# Patient Record
Sex: Female | Born: 1995 | Race: White | Hispanic: No | Marital: Single | Smoking: Never smoker
Health system: Southern US, Community
[De-identification: ages and names within clinical notes are randomized; demographics above are authoritative.]

## PROBLEM LIST (undated history)

## (undated) ENCOUNTER — Inpatient Hospital Stay (HOSPITAL_COMMUNITY): Payer: Self-pay

## (undated) DIAGNOSIS — J45909 Unspecified asthma, uncomplicated: Secondary | ICD-10-CM

## (undated) DIAGNOSIS — N83209 Unspecified ovarian cyst, unspecified side: Secondary | ICD-10-CM

## (undated) HISTORY — DX: Unspecified asthma, uncomplicated: J45.909

## (undated) HISTORY — DX: Unspecified ovarian cyst, unspecified side: N83.209

## (undated) HISTORY — PX: WISDOM TOOTH EXTRACTION: SHX21

---
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
## 2016-02-17 ENCOUNTER — Ambulatory Visit: Payer: Self-pay | Admitting: Allergy and Immunology

## 2016-03-02 ENCOUNTER — Ambulatory Visit (INDEPENDENT_AMBULATORY_CARE_PROVIDER_SITE_OTHER): Payer: Managed Care, Other (non HMO) | Admitting: Allergy and Immunology

## 2016-03-02 ENCOUNTER — Encounter: Payer: Self-pay | Admitting: Allergy and Immunology

## 2016-03-02 VITALS — BP 98/72 | HR 68 | Resp 16 | Ht 64.96 in | Wt 145.3 lb

## 2016-03-02 DIAGNOSIS — L209 Atopic dermatitis, unspecified: Secondary | ICD-10-CM | POA: Diagnosis not present

## 2016-03-02 DIAGNOSIS — H1010 Acute atopic conjunctivitis, unspecified eye: Secondary | ICD-10-CM

## 2016-03-02 DIAGNOSIS — J309 Allergic rhinitis, unspecified: Secondary | ICD-10-CM

## 2016-03-02 DIAGNOSIS — J4520 Mild intermittent asthma, uncomplicated: Secondary | ICD-10-CM

## 2016-03-02 DIAGNOSIS — Z91018 Allergy to other foods: Secondary | ICD-10-CM

## 2016-03-02 MED ORDER — DESONIDE 0.05 % EX OINT: TOPICAL_OINTMENT | CUTANEOUS | Status: DC

## 2016-03-02 MED ORDER — FLUTICASONE PROPIONATE HFA 220 MCG/ACT IN AERO: INHALATION_SPRAY | RESPIRATORY_TRACT | Status: DC

## 2016-03-02 MED ORDER — EPINEPHRINE 0.3 MG/0.3ML IJ SOAJ: INTRAMUSCULAR | Status: AC

## 2016-03-02 NOTE — Progress Notes (Signed)
NEW PATIENT NOTE  Referring Provider: No ref. provider found Primary Provider: No PCP Per Patient Date of office visit: 03/02/2016    Subjective:   Chief Complaint:  Isabella Lam (DOB: 06/19/1996) is a 20 y.o. female with a chief complaint of Urticaria and Allergic Rhinitis   who presents to the clinic on 03/02/2016 with the following problems:  HPI Comments: Isabella Lam presents this clinic in evaluation of allergies. She has had a long history of upper airway allergic disease manifested as nasal congestion, sneezing, intermittent anosmia, especially following exposure to dust, mold, and grasses. She is not really found antihistamines to be particularly effective in alleviating her symptoms. She'll use Nasonex as needed when she gets extremely congested. She also has a history of asthma that is relatively inactive. Her last requirement for bronchodilator was October 2016. She does not appear to have an exercise-induced component or cold air induced component. She was hospitalized in 2014 in 2015 for episodes of asthma. However, in 2016 she did not require hospitalization. Apparently both these hospitalizations occurred in May of those years. She was living in the [place] at that point in time. She is a native of [place] and only presented to the [place] area in August 2016. She also has a history of peanut allergy giving rise to breathing problems. She avoids all nuts at this point in time and had an EpiPen when she was younger. Recently she is developed a bit of a rash around her mouth that is slowly resolving.   Past Medical History  Diagnosis Date  . Ovarian cyst   . Asthma     Past Surgical History  Procedure Laterality Date  . Wisdom tooth extraction        Medication List       This list is accurate as of: 03/02/16 11:59 PM.  Always use your most recent med list.               CLARITIN-D 24 HOUR PO  Take by mouth daily.     desonide 0.05 % ointment   Commonly known as:  DESOWEN  Can apply to inflamed skin twice daily as needed.     EPINEPHrine 0.3 mg/0.3 mL Soaj injection  Commonly known as:  EPI-PEN  Use as directed for life-threatening allergic reaction.     fluticasone 220 MCG/ACT inhaler  Commonly known as:  FLOVENT HFA  Inhale two puffs twice daily during asthma flare-up.  Rinse, gargle, and spit after use.     LOW-OGESTREL PO  Take by mouth daily.     mometasone 50 MCG/ACT nasal spray  Commonly known as:  NASONEX  Place 1 spray into the nose as needed.     montelukast 10 MG tablet  Commonly known as:  SINGULAIR  Take 10 mg by mouth daily.     VENTOLIN HFA 108 (90 Base) MCG/ACT inhaler  Generic drug:  albuterol  Inhale 2 puffs into the lungs every 6 (six) hours as needed for wheezing or shortness of breath.        Allergies  Allergen Reactions  . Clindamycin/Lincomycin Hives, Itching and Swelling  . Keflex [Cephalexin] Itching and Swelling  . Oxycodone Hives and Swelling  . Penicillins Itching and Swelling  . Hydrocodone Hives, Itching and Rash  . Hydrocortisone Rash  . Ibuprofen Hives, Itching, Swelling and Rash    Joints swell  . Tylenol [Acetaminophen] Swelling and Rash    Review of systems negative except as noted  in HPI / PMHx or noted below:  Review of Systems  Constitutional: Negative.   HENT: Negative.   Eyes: Negative.   Respiratory: Negative.   Cardiovascular: Negative.   Gastrointestinal: Negative.   Genitourinary: Negative.   Musculoskeletal: Negative.   Skin: Negative.   Neurological: Negative.   Endo/Heme/Allergies: Negative.   Psychiatric/Behavioral: Negative.     Family History  Problem Relation Age of Onset  . Diabetes Mother   . High blood pressure Father   . Diabetes Maternal Grandmother   . High blood pressure Maternal Grandfather     Social History   Social History  . Marital Status: Single    Spouse Name: N/A  . Number of Children: N/A  . Years of Education: N/A    Occupational History  . Not on file.   Social History Main Topics  . Smoking status: Never Smoker   . Smokeless tobacco: Never Used  . Alcohol Use: Not on file  . Drug Use: Not on file  . Sexual Activity: Not on file   Other Topics Concern  . Not on file   Social History Narrative  . No narrative on file    Environmental and Social history  Lives in a dorm room with a slightly damp environment, no animals located inside the residents, carpeting in the bedroom, no plastic on the bed or pillow, and no smokers located inside the household.   Objective:   Filed Vitals:   03/02/16 0855  BP: 98/72  Pulse: 68  Resp: 16   Height: 5' 4.96" (165 cm) Weight: 145 lb 4.5 oz (65.9 kg)  Physical Exam  Constitutional: She is well-developed, well-nourished, and in no distress.  HENT:  Head: Normocephalic. Head is without right periorbital erythema and without left periorbital erythema.  Right Ear: Tympanic membrane, external ear and ear canal normal.  Left Ear: Tympanic membrane, external ear and ear canal normal.  Nose: Nose normal. No mucosal edema or rhinorrhea.  Mouth/Throat: Oropharynx is clear and moist and mucous membranes are normal. No oropharyngeal exudate.  Slight lip erythema and induration  Eyes: Conjunctivae and lids are normal. Pupils are equal, round, and reactive to light.  Neck: Trachea normal. No tracheal deviation present. No thyromegaly present.  Cardiovascular: Normal rate, regular rhythm, S1 normal, S2 normal and normal heart sounds.   No murmur heard. Pulmonary/Chest: Effort normal. No stridor. No tachypnea. No respiratory distress. She has no wheezes. She has no rales. She exhibits no tenderness.  Abdominal: Soft. She exhibits no distension and no mass. There is no hepatosplenomegaly. There is no tenderness. There is no rebound and no guarding.  Musculoskeletal: She exhibits no edema or tenderness.  Lymphadenopathy:       Head (right side): No tonsillar  adenopathy present.       Head (left side): No tonsillar adenopathy present.    She has no cervical adenopathy.    She has no axillary adenopathy.  Neurological: She is alert. Gait normal.  Skin: No rash noted. She is not diaphoretic. No erythema. No pallor. Nails show no clubbing.  Psychiatric: Mood and affect normal.     Diagnostics: Allergy skin tests were performed. She demonstrated hypersensitivity to house dust mite and mold  Spirometry was performed and demonstrated an FEV1 of 2.83 @ 102 % of predicted. Following the administration of nebulized albuterol her FEV1 did not change significantly  The patient had an Asthma Control Test with the following results:  .     Assessment and Plan:  1. Asthma, mild intermittent, well-controlled   2. Allergic rhinoconjunctivitis   3. Food allergy   4. Atopic dermatitis     1. Allergen avoidance measures  2. Treat and prevent inflammation:   A. montelukast 10 mg one tablet one time per day  B. Nasonex 1-2 sprays each nostril one time per day  3. If needed:   A. Ventolin HFA 2 puffs every 4-6 hours  B. OTC antihistamine - Claritin/Allegra/Zyrtec  C. EpiPen, Benadryl, M.D./ER for allergic reaction  D. OTC 1% hydrocortisone cream to inflamed skin twice a day  4. "Action plan" for asthma flare up:   A. start Flovent 220 2 inhalations twice a day with spacing device  B. continue Ventolin HFA if needed  5. Consider immunotherapy  6. Annual fall flu vaccine every year  7. Blood tests - peanut with reflex - possible peanut challenge?  8. Return to clinic in 4 weeks   HopefullyTakia will do well with a combination of allergen avoidance measures and anti-inflammatory medications used in a preventative mode. I'm somewhat worried as we go through this upcoming springtime season she may have some difficulty as this will be her first springtime exposure in [place]. I have given her plan to initiate high-dose inhaled steroids  should she develop a significant problem with her lungs as we move forward. We'll see if things go over the course the next 4 weeks. We'll also see if she is a candidate for a peanut challenge sometime in the future.    Laurette Schimke, MD [facility]

## 2016-03-02 NOTE — Patient Instructions (Addendum)
  1. Allergen avoidance measures  2. Treat and prevent inflammation:   A. montelukast 10 mg one tablet one time per day  B. Nasonex 1-2 sprays each nostril one time per day  3. If needed:   A. Ventolin HFA 2 puffs every 4-6 hours  B. OTC antihistamine - Claritin/Allegra/Zyrtec  C. EpiPen, Benadryl, M.D./ER for allergic reaction  D. Desonide Ointment apply to inflamed skin twice a day  4. "Action plan" for asthma flare up:   A. start Flovent 220 2 inhalations twice a day with spacing device  B. continue Ventolin HFA if needed  5. Consider immunotherapy  6. Annual fall flu vaccine every year  7. Blood tests - peanut with reflex - possible peanut challenge?  8. Return to clinic in 4 weeks

## 2016-03-04 ENCOUNTER — Other Ambulatory Visit: Payer: Self-pay

## 2016-03-04 MED ORDER — BUDESONIDE 180 MCG/ACT IN AEPB: 2.0000 | INHALATION_SPRAY | Freq: Two times a day (BID) | RESPIRATORY_TRACT | Status: AC

## 2016-03-08 ENCOUNTER — Ambulatory Visit: Payer: Self-pay | Admitting: Allergy and Immunology

## 2016-03-28 ENCOUNTER — Other Ambulatory Visit: Payer: Self-pay | Admitting: Allergy and Immunology

## 2016-03-30 ENCOUNTER — Ambulatory Visit: Admitting: Allergy and Immunology

## 2016-03-30 LAB — IGE PEANUT W/COMPONENT REFLEX

## 2016-04-13 ENCOUNTER — Ambulatory Visit (INDEPENDENT_AMBULATORY_CARE_PROVIDER_SITE_OTHER): Payer: Managed Care, Other (non HMO) | Admitting: Allergy and Immunology

## 2016-04-13 DIAGNOSIS — L700 Acne vulgaris: Secondary | ICD-10-CM

## 2016-04-13 DIAGNOSIS — J4520 Mild intermittent asthma, uncomplicated: Secondary | ICD-10-CM

## 2016-04-13 DIAGNOSIS — H1010 Acute atopic conjunctivitis, unspecified eye: Secondary | ICD-10-CM

## 2016-04-13 DIAGNOSIS — Z91018 Allergy to other foods: Secondary | ICD-10-CM | POA: Diagnosis not present

## 2016-04-13 DIAGNOSIS — J309 Allergic rhinitis, unspecified: Secondary | ICD-10-CM | POA: Diagnosis not present

## 2016-04-13 DIAGNOSIS — L209 Atopic dermatitis, unspecified: Secondary | ICD-10-CM | POA: Diagnosis not present

## 2016-04-13 NOTE — Progress Notes (Signed)
Follow-up Note  Referring Provider: No ref. provider found Primary Provider: No PCP Per Patient Date of Office Visit: 04/13/2016  Subjective:   Isabella Lam (DOB: 1996-01-11) is a 20 y.o. female [facility] in re-evaluation of the following:  HPI: Isabella Lam presents to this clinic in reevaluation of her multiorgan atopic disease. She's had a very good response to medical therapy. She's been using montelukast and Nasonex and has had very little problems with her upper or lower airways or skin. She's not had to use Ventolin or desonide. She remains away from peanut and does have an EpiPen. She has not had activate her action plan for an asthma flare. She has performed allergen avoidance measures against house dust mite.    Medication List           budesonide 180 MCG/ACT inhaler  Commonly known as:  PULMICORT FLEXHALER  Inhale 2 puffs into the lungs 2 (two) times daily.     CLARITIN-D 24 HOUR PO  Take by mouth daily.     desonide 0.05 % ointment  Commonly known as:  DESOWEN  Can apply to inflamed skin twice daily as needed.     EPINEPHrine 0.3 mg/0.3 mL Soaj injection  Commonly known as:  EPI-PEN  Use as directed for life-threatening allergic reaction.     fluticasone 220 MCG/ACT inhaler  Commonly known as:  FLOVENT HFA  Inhale two puffs twice daily during asthma flare-up.  Rinse, gargle, and spit after use.     LOW-OGESTREL PO  Take by mouth daily.     mometasone 50 MCG/ACT nasal spray  Commonly known as:  NASONEX  Place 1 spray into the nose as needed.     montelukast 10 MG tablet  Commonly known as:  SINGULAIR  Take 10 mg by mouth daily.     VENTOLIN HFA 108 (90 Base) MCG/ACT inhaler  Generic drug:  albuterol  Inhale 2 puffs into the lungs every 6 (six) hours as needed for wheezing or shortness of breath.        Past Medical History  Diagnosis Date  . Ovarian cyst   . Asthma     Past Surgical History    Procedure Laterality Date  . Wisdom tooth extraction      Allergies  Allergen Reactions  . Clindamycin/Lincomycin Hives, Itching and Swelling  . Keflex [Cephalexin] Itching and Swelling  . Oxycodone Hives and Swelling  . Penicillins Itching and Swelling  . Hydrocodone Hives, Itching and Rash  . Hydrocortisone Rash  . Ibuprofen Hives, Itching, Swelling and Rash    Joints swell  . Tylenol [Acetaminophen] Swelling and Rash    Review of systems negative except as noted in HPI / PMHx or noted below:  Review of Systems  Constitutional: Negative.   HENT: Negative.   Eyes: Negative.   Respiratory: Negative.   Cardiovascular: Negative.   Gastrointestinal: Negative.   Genitourinary: Negative.   Musculoskeletal: Negative.   Skin: Negative.   Neurological: Negative.   Endo/Heme/Allergies: Negative.   Psychiatric/Behavioral: Negative.      Objective:   There were no vitals filed for this visit.        Physical Exam  Constitutional: She is well-developed, well-nourished, and in no distress.  HENT:  Head: Normocephalic.  Right Ear: Tympanic membrane, external ear and ear canal normal.  Left Ear: Tympanic membrane, external ear and ear canal normal.  Nose: Nose normal. No mucosal edema or rhinorrhea.  Mouth/Throat:  Uvula is midline, oropharynx is clear and moist and mucous membranes are normal. No oropharyngeal exudate.  Eyes: Conjunctivae are normal.  Neck: Trachea normal. No tracheal tenderness present. No tracheal deviation present. No thyromegaly present.  Cardiovascular: Normal rate, regular rhythm, S1 normal, S2 normal and normal heart sounds.   No murmur heard. Pulmonary/Chest: Breath sounds normal. No stridor. No respiratory distress. She has no wheezes. She has no rales.  Musculoskeletal: She exhibits no edema.  Lymphadenopathy:       Head (right side): No tonsillar adenopathy present.       Head (left side): No tonsillar adenopathy present.    She has no cervical  adenopathy.  Neurological: She is alert. Gait normal.  Skin: Rash (Acne with cystic lesions on face and slight involvement of upper back) noted. She is not diaphoretic. No erythema. Nails show no clubbing.  Psychiatric: Mood and affect normal.    Diagnostics: Results of a peanut IgG titer obtained on 03/28/2016 was below 0.10 KU/l   Spirometry was performed and demonstrated an FEV1 of 2.41 at 88 % of predicted.  The patient had an Asthma Control Test with the following results:  .    Assessment and Plan:   1. Asthma, mild intermittent, well-controlled   2. Allergic rhinoconjunctivitis   3. Food allergy   4. Atopic dermatitis   5. Acne vulgaris     1. Continue  Allergen avoidance measures  2. Continue to Treat and prevent inflammation:   A. montelukast 10 mg one tablet one time per day  B. Nasonex 1-2 sprays each nostril one time per day  3. If needed:   A. Ventolin HFA 2 puffs every 4-6 hours  B. OTC antihistamine - Claritin/Allegra/Zyrtec  C. EpiPen, Benadryl, M.D./ER for allergic reaction  D. Desonide Ointment apply to inflamed skin twice a day  4. "Action plan" for asthma flare up:   A. start Flovent 220 2 inhalations twice a day with spacing device  B. continue Ventolin HFA if needed  5. Start Differin 0.1% cream apply to acne one time per day  6. Annual fall flu vaccine every year  7. Return to clinic in 6 weeks or soon after returning from [place] will return to this clinic in reevaluation of her acne after utilizing at least 6 weeks of Differin. She is going to the [place] for the summer so I may have to see her back a little bit past 6 weeks. Her multiorgan atopic disease appears to be under very good control with her current plan and I see no need for stepping forward regarding further evaluation of this issue at this point. She is not interested in starting a course of immunotherapy. She is also not interested in undergoing a peanut  challenge and she would like to just avoid consumption of this food at this point in time.  Laurette Schimke, MD [facility]

## 2016-04-13 NOTE — Patient Instructions (Signed)
  1. Continue  Allergen avoidance measures  2. Continue to Treat and prevent inflammation:   A. montelukast 10 mg one tablet one time per day  B. Nasonex 1-2 sprays each nostril one time per day  3. If needed:   A. Ventolin HFA 2 puffs every 4-6 hours  B. OTC antihistamine - Claritin/Allegra/Zyrtec  C. EpiPen, Benadryl, M.D./ER for allergic reaction  D. Desonide Ointment apply to inflamed skin twice a day  4. "Action plan" for asthma flare up:   A. start Flovent 220 2 inhalations twice a day with spacing device  B. continue Ventolin HFA if needed  5. Start Differin 0.1% cream apply to acne one time per day  6. Annual fall flu vaccine every year  7. Return to clinic in 6 weeks or soon after returning from [place]

## 2016-04-14 ENCOUNTER — Encounter: Payer: Self-pay | Admitting: Allergy and Immunology

## 2016-04-14 MED ORDER — ADAPALENE 0.1 % EX CREA: TOPICAL_CREAM | Freq: Every day | CUTANEOUS | Status: DC

## 2016-04-20 ENCOUNTER — Other Ambulatory Visit: Payer: Self-pay | Admitting: *Deleted

## 2016-04-20 MED ORDER — BECLOMETHASONE DIPROPIONATE 80 MCG/ACT IN AERS: 2.0000 | INHALATION_SPRAY | Freq: Two times a day (BID) | RESPIRATORY_TRACT | Status: DC | PRN

## 2016-04-20 NOTE — Telephone Encounter (Signed)
Informed patient insurance would not pay for Flovent and was sending in Qvar. Patient understood plan.

## 2016-07-26 ENCOUNTER — Ambulatory Visit (INDEPENDENT_AMBULATORY_CARE_PROVIDER_SITE_OTHER): Payer: Managed Care, Other (non HMO) | Admitting: Allergy and Immunology

## 2016-07-26 ENCOUNTER — Encounter: Payer: Self-pay | Admitting: Allergy and Immunology

## 2016-07-26 VITALS — BP 110/72 | HR 64 | Resp 16

## 2016-07-26 DIAGNOSIS — J309 Allergic rhinitis, unspecified: Secondary | ICD-10-CM

## 2016-07-26 DIAGNOSIS — Z91018 Allergy to other foods: Secondary | ICD-10-CM

## 2016-07-26 DIAGNOSIS — J4530 Mild persistent asthma, uncomplicated: Secondary | ICD-10-CM

## 2016-07-26 DIAGNOSIS — L700 Acne vulgaris: Secondary | ICD-10-CM | POA: Diagnosis not present

## 2016-07-26 DIAGNOSIS — H1010 Acute atopic conjunctivitis, unspecified eye: Secondary | ICD-10-CM

## 2016-07-26 DIAGNOSIS — L209 Atopic dermatitis, unspecified: Secondary | ICD-10-CM | POA: Diagnosis not present

## 2016-07-26 MED ORDER — MONTELUKAST SODIUM 10 MG PO TABS: 10.0000 mg | ORAL_TABLET | Freq: Every day | ORAL | 5 refills | Status: AC

## 2016-07-26 MED ORDER — FLUTICASONE FUROATE 27.5 MCG/SPRAY NA SUSP: 1.0000 | Freq: Every day | NASAL | 5 refills | Status: DC

## 2016-07-26 NOTE — Progress Notes (Signed)
Follow-up Note  Referring Provider: No ref. provider found Primary Provider: No PCP Per Patient Date of Office Visit: 07/26/2016  Subjective:   Isabella Lam (DOB: March 17, 1996) is a 20 y.o. female who [facility] in re-evaluation of the following:  HPI: Isabella Lam returns to this clinic in evaluation of her asthma and allergic rhinoconjunctivitis and history of atopic dermatitis and food allergy. I've not seen her in his clinic since May 2017.  During the interval her asthma has been under excellent control and she rarely uses any Ventolin and she can exercise without any difficulty and has not required a systemic steroid or activation of her action plan for asthma flare..  Likewise, her nose has been doing very well while using montelukast and Veramyst and she has not required an antibiotic for an episode of sinusitis.  Her atopic dermatitis has been under excellent control and she's had no need to apply desonide ointment at this point in time. She could not tolerate Differin apply to her skin for her acne therapy.  She still remains away from peanut at this point. Her last peanut IgE titer in April 2017 was very low and it was recommended that she undergo a food challenge with peanut but she is not very interested in pursuing this evaluation at this point.    Medication List      adapalene 0.1 % cream Commonly known as:  DIFFERIN Apply topically at bedtime.   budesonide 180 MCG/ACT inhaler Commonly known as:  PULMICORT FLEXHALER Inhale 2 puffs into the lungs 2 (two) times daily.   CLARITIN-D 24 HOUR PO Take by mouth daily.   desonide 0.05 % ointment Commonly known as:  DESOWEN Can apply to inflamed skin twice daily as needed.   EPINEPHrine 0.3 mg/0.3 mL Soaj injection Commonly known as:  EPI-PEN Use as directed for life-threatening allergic reaction.   fluticasone 27.5 MCG/SPRAY nasal spray Commonly known as:  VERAMYST Place 1  spray into the nose daily.   mometasone 50 MCG/ACT nasal spray Commonly known as:  NASONEX Place 1 spray into the nose as needed.   montelukast 10 MG tablet Commonly known as:  SINGULAIR Take 1 tablet (10 mg total) by mouth daily.   VENTOLIN HFA 108 (90 Base) MCG/ACT inhaler Generic drug:  albuterol Inhale 2 puffs into the lungs every 6 (six) hours as needed for wheezing or shortness of breath.       Past Medical History:  Diagnosis Date  . Asthma   . Ovarian cyst     Past Surgical History:  Procedure Laterality Date  . WISDOM TOOTH EXTRACTION      Allergies  Allergen Reactions  . Clindamycin/Lincomycin Hives, Itching and Swelling  . Keflex [Cephalexin] Itching and Swelling  . Oxycodone Hives and Swelling  . Penicillins Itching and Swelling  . Hydrocodone Hives, Itching and Rash  . Hydrocortisone Rash  . Ibuprofen Hives, Itching, Swelling and Rash    Joints swell  . Tylenol [Acetaminophen] Swelling and Rash    Review of systems negative except as noted in HPI / PMHx or noted below:  Review of Systems  Constitutional: Negative.   HENT: Negative.   Eyes: Negative.   Respiratory: Negative.   Cardiovascular: Negative.   Gastrointestinal: Negative.   Genitourinary: Negative.   Musculoskeletal: Negative.   Skin: Negative.   Neurological: Negative.   Endo/Heme/Allergies: Negative.   Psychiatric/Behavioral: Negative.      Objective:   Vitals:   07/26/16  1049  BP: 110/72  Pulse: 64  Resp: 16          Physical Exam  Constitutional: She is well-developed, well-nourished, and in no distress.  HENT:  Head: Normocephalic.  Right Ear: Tympanic membrane, external ear and ear canal normal.  Left Ear: Tympanic membrane, external ear and ear canal normal.  Nose: Nose normal. No mucosal edema or rhinorrhea.  Mouth/Throat: Uvula is midline, oropharynx is clear and moist and mucous membranes are normal. No oropharyngeal exudate.  Eyes: Conjunctivae are normal.    Neck: Trachea normal. No tracheal tenderness present. No tracheal deviation present. No thyromegaly present.  Cardiovascular: Normal rate, regular rhythm, S1 normal, S2 normal and normal heart sounds.   No murmur heard. Pulmonary/Chest: Breath sounds normal. No stridor. No respiratory distress. She has no wheezes. She has no rales.  Musculoskeletal: She exhibits no edema.  Lymphadenopathy:       Head (right side): No tonsillar adenopathy present.       Head (left side): No tonsillar adenopathy present.    She has no cervical adenopathy.  Neurological: She is alert. Gait normal.  Skin: Rash (Acne involving face) noted. She is not diaphoretic. No erythema. Nails show no clubbing.  Psychiatric: Mood and affect normal.    Diagnostics:    Spirometry was performed and demonstrated an FEV1 of 2.66 at 98 % of predicted.  The patient had an Asthma Control Test with the following results:  .    Assessment and Plan:   1. Mild persistent asthma, uncomplicated   2. Allergic rhinoconjunctivitis   3. Food allergy   4. Atopic dermatitis   5. Acne vulgaris     1. Continue  Allergen avoidance measures  2. Continue to Treat and prevent inflammation:   A. montelukast 10 mg one tablet one time per day  B. Veramyst 1-2 sprays each nostril one time per day  3. If needed:   A. Ventolin HFA 2 puffs every 4-6 hours  B. OTC antihistamine - Claritin/Allegra/Zyrtec  C. EpiPen, Benadryl, M.D./ER for allergic reaction  D. Desonide Ointment apply to inflamed skin twice a day  4. "Action plan" for asthma flare up:   A. start Flovent 220 2 inhalations twice a day with spacing device  B. continue Ventolin HFA if needed  5. Annual fall flu vaccine every year  6. Return to clinic in 6 months or earlier if problem  7. Visit with dermatologist concerning treatment of acne  Isabella Lam appears to be doing relatively well regarding her multiorgan atopic disease and I see no need for changing her medical  therapy at this point in time. We will refill her medications and see her back in this clinic in approximately 6 months or earlier if there is a problem. I did ask her to visit with her dermatologist concerning her rather significant facial acne as our initial attempt at treating this issue did not help very much. She will contact me during the interval should there be a significant problem.  Isabella SchimkeEric Lexie Morini, MD [facility]

## 2016-07-26 NOTE — Patient Instructions (Addendum)
  1. Continue  Allergen avoidance measures  2. Continue to Treat and prevent inflammation:   A. montelukast 10 mg one tablet one time per day  B. Veramyst 1-2 sprays each nostril one time per day  3. If needed:   A. Ventolin HFA 2 puffs every 4-6 hours  B. OTC antihistamine - Claritin/Allegra/Zyrtec  C. EpiPen, Benadryl, M.D./ER for allergic reaction  D. Desonide Ointment apply to inflamed skin twice a day  4. "Action plan" for asthma flare up:   A. start Flovent 220 2 inhalations twice a day with spacing device  B. continue Ventolin HFA if needed  5. Annual fall flu vaccine every year  6. Return to clinic in 6 months or earlier if problem  7. Visit with dermatologist concerning treatment of acne

## 2016-10-31 ENCOUNTER — Encounter (HOSPITAL_COMMUNITY): Payer: Self-pay | Admitting: Nurse Practitioner

## 2016-10-31 ENCOUNTER — Emergency Department (HOSPITAL_COMMUNITY)
Admission: EM | Admit: 2016-10-31 | Discharge: 2016-10-31 | Disposition: A | Discharge status: Home / Self Care | Payer: Managed Care, Other (non HMO) | Attending: Emergency Medicine | Admitting: Emergency Medicine

## 2016-10-31 DIAGNOSIS — Z79899 Other long term (current) drug therapy: Secondary | ICD-10-CM | POA: Insufficient documentation

## 2016-10-31 DIAGNOSIS — J45909 Unspecified asthma, uncomplicated: Secondary | ICD-10-CM | POA: Insufficient documentation

## 2016-10-31 DIAGNOSIS — R220 Localized swelling, mass and lump, head: Secondary | ICD-10-CM | POA: Diagnosis not present

## 2016-10-31 DIAGNOSIS — Z91010 Allergy to peanuts: Secondary | ICD-10-CM | POA: Diagnosis not present

## 2016-10-31 MED ORDER — PREDNISONE 20 MG PO TABS
60.0000 mg | ORAL_TABLET | Freq: Once | ORAL | Status: AC
  Administered 2016-10-31: 60 mg via ORAL
  Filled 2016-10-31: qty 3

## 2016-10-31 MED ORDER — DIPHENHYDRAMINE HCL 25 MG PO CAPS: 25.0000 mg | ORAL_CAPSULE | ORAL | 0 refills | Status: AC | PRN

## 2016-10-31 MED ORDER — DIPHENHYDRAMINE HCL 25 MG PO CAPS
50.0000 mg | ORAL_CAPSULE | Freq: Once | ORAL | Status: AC
  Administered 2016-10-31: 50 mg via ORAL
  Filled 2016-10-31: qty 2

## 2016-10-31 NOTE — ED Provider Notes (Signed)
[facility] Provider Note   CSN: [id number] Arrival date & time: 10/31/16  1148  By signing my name below, I, Emmanuella Mensah, attest that this documentation has been prepared under the direction and in the presence of Melburn HakeNicole Feliz [place], [place]. Electronically Signed: Angelene GiovanniEmmanuella Mensah, ED Scribe. 10/31/16. 12:28 PM.   History   Chief Complaint Chief Complaint  Patient presents with  . Oral Swelling    HPI Comments: Isabella Lam is a 20 y.o. female with a hx of asthma who presents to the Emergency Department complaining of persistent non-painful, non-itchy upper lip swelling with numbness onset waking up at 6 am this morning. She notes that the swelling has been mildly worsening since waking up but has stayed the same within the last hour. She states that she has tried one dose of Benadryl at 6 am and another at 8 am with no relief. She denies any new foods, lotions, soaps, detergent, tooth paste, or any tick exposure. She reports that the only thing different was finishing her 7 day course of norethindrone last night. Pt has an allergy to clindamycin, keflex, oxycodone, peanut-containing drugs, tomato, penicillins, ibuprofen, and tylenol. She denies any fever, chills, headaches, tongue swelling, sore throat, trouble swallowing, wheezing, shortness of breath, chest pain, vomiting, itching, generalized rash, color changes, or any other symptoms.   The history is provided by the patient. No language interpreter was used.    Past Medical History:  Diagnosis Date  . Asthma   . Ovarian cyst     There are no active problems to display for this patient.   Past Surgical History:  Procedure Laterality Date  . WISDOM TOOTH EXTRACTION      OB History    No data available       Home Medications    Prior to Admission medications   Medication Sig Start Date End Date Taking? Authorizing Provider  adapalene (DIFFERIN) 0.1 % cream Apply topically at bedtime. Patient not taking:  Reported on 07/26/2016 04/14/16   Jessica PriestEric J Kozlow, MD  albuterol (VENTOLIN HFA) 108 (90 Base) MCG/ACT inhaler Inhale 2 puffs into the lungs every 6 (six) hours as needed for wheezing or shortness of breath.    Historical Provider, MD  budesonide (PULMICORT FLEXHALER) 180 MCG/ACT inhaler Inhale 2 puffs into the lungs 2 (two) times daily. 03/04/16   Jessica PriestEric J Kozlow, MD  desonide (DESOWEN) 0.05 % ointment Can apply to inflamed skin twice daily as needed. 03/02/16   Jessica PriestEric J Kozlow, MD  diphenhydrAMINE (BENADRYL) 25 mg capsule Take 1 capsule (25 mg total) by mouth every 4 (four) hours as needed (lip swelling). 10/31/16   Barrett HenleNicole Elizabeth Mikaya Bunner, PA-C  EPINEPHrine 0.3 mg/0.3 mL IJ SOAJ injection Use as directed for life-threatening allergic reaction. 03/02/16   Jessica PriestEric J Kozlow, MD  fluticasone (VERAMYST) 27.5 MCG/SPRAY nasal spray Place 1 spray into the nose daily. 07/26/16   Jessica PriestEric J Kozlow, MD  Loratadine-Pseudoephedrine (CLARITIN-D 24 HOUR PO) Take by mouth daily.    Historical Provider, MD  mometasone (NASONEX) 50 MCG/ACT nasal spray Place 1 spray into the nose as needed.    Historical Provider, MD  montelukast (SINGULAIR) 10 MG tablet Take 1 tablet (10 mg total) by mouth daily. 07/26/16   Jessica PriestEric J Kozlow, MD    Family History Family History  Problem Relation Age of Onset  . Diabetes Mother   . High blood pressure Father   . Diabetes Maternal Grandmother   . High blood pressure Maternal Grandfather  Social History Social History  Substance Use Topics  . Smoking status: Never Smoker  . Smokeless tobacco: Never Used  . Alcohol use Not on file     Allergies   Clindamycin/lincomycin; Keflex [cephalexin]; Oxycodone; Peanut-containing drug products; Penicillins; Tomato; Hydrocodone; Hydrocortisone; Ibuprofen; and Tylenol [acetaminophen]   Review of Systems Review of Systems  Constitutional: Negative for chills and fever.  HENT: Positive for facial swelling. Negative for sore throat and trouble  swallowing.   Respiratory: Negative for shortness of breath and wheezing.   Cardiovascular: Negative for chest pain.  Gastrointestinal: Negative for vomiting.  Skin: Negative for color change, rash and wound.  Neurological: Negative for headaches.     Physical Exam Updated Vital Signs BP 147/88 (BP Location: Right Arm)   Pulse 103   Temp 98.7 F (37.1 C) (Oral)   Resp 18   Ht 5\' 6"  (1.676 m)   Wt 63 kg   SpO2 100%   BMI 22.44 kg/m   Physical Exam  Constitutional: She is oriented to person, place, and time. She appears well-developed and well-nourished.  HENT:  Head: Normocephalic and atraumatic.  Mouth/Throat: Uvula is midline, oropharynx is clear and moist and mucous membranes are normal. No oral lesions. No trismus in the jaw. Normal dentition. No dental abscesses. No oropharyngeal exudate, posterior oropharyngeal edema, posterior oropharyngeal erythema or tonsillar abscesses. No tonsillar exudate.  Mild swelling noted to left upper lip, no TTP, sensation grossly intact. Otherwise no facial or neck swelling; floor of mouth soft; no swelling of tongue; pt tolerating secretion  Eyes: Conjunctivae and EOM are normal. Right eye exhibits no discharge. Left eye exhibits no discharge. No scleral icterus.  Neck: Normal range of motion. Neck supple.  Cardiovascular: Normal rate, regular rhythm, normal heart sounds and intact distal pulses.   HR 92  Pulmonary/Chest: Effort normal and breath sounds normal. No stridor. No respiratory distress. She has no wheezes. She has no rales. She exhibits no tenderness.  Abdominal: Soft. She exhibits no distension. There is no tenderness.  Musculoskeletal: Normal range of motion. She exhibits no edema.  Lymphadenopathy:    She has no cervical adenopathy.  Neurological: She is alert and oriented to person, place, and time.  Skin: Skin is warm and dry. No rash noted.  Nursing note and vitals reviewed.    ED Treatments / Results  DIAGNOSTIC  STUDIES: Oxygen Saturation is 100% on RA, normal by my interpretation.    COORDINATION OF CARE: 12:20 PM- Pt advised of plan for treatment and pt agrees. Pt will receive prednisone and benadryl here.    Labs (all labs ordered are listed, but only abnormal results are displayed) Labs Reviewed - No data to display  EKG  EKG Interpretation None       Radiology No results found.  Procedures Procedures (including critical care time)  Medications Ordered in ED Medications  diphenhydrAMINE (BENADRYL) capsule 50 mg (50 mg Oral Given 10/31/16 1225)  predniSONE (DELTASONE) tablet 60 mg (60 mg Oral Given 10/31/16 1225)     Initial Impression / Assessment and Plan / ED Course  Melburn HakeNicole Burhan Barham, PA-C has reviewed the triage vital signs and the nursing notes.  Pertinent labs & imaging results that were available during my care of the patient were reviewed by me and considered in my medical decision making (see chart for details).  Clinical Course     Patient re-evaluated prior to dc, is hemodynamically stable, in no respiratory distress, and denies the feeling of throat closing. Pt has been  advised to take OTC benadryl & return to the ED if they have a mod-severe allergic rxn (s/s including throat closing, difficulty breathing, swelling of lips face or tongue). Pt is to follow up with their PCP. Pt is agreeable with plan & verbalizes understanding.   Final Clinical Impressions(s) / ED Diagnoses   Final diagnoses:  Swelling of upper lip    New Prescriptions Discharge Medication List as of 10/31/2016  1:38 PM    START taking these medications   Details  diphenhydrAMINE (BENADRYL) 25 mg capsule Take 1 capsule (25 mg total) by mouth every 4 (four) hours as needed (lip swelling)., Starting Mon 10/31/2016, Print       I personally performed the services described in this documentation, which was scribed in my presence. The recorded information has been reviewed and is accurate.      Satira Sark [place], [place] 10/31/16 1351    Eber Hong, MD 10/31/16 1718

## 2016-10-31 NOTE — ED Notes (Signed)
D/C paperwork reviewed and understanding verbalized

## 2016-10-31 NOTE — ED Triage Notes (Signed)
Pt presents with c/o upper lip swelling. She woke this morning with the swelling. The lip feels numb. She took her last dose of norethindone for uterine dysfunction last night. She denies the use of any other new products or medications. She denies injury, throat swelling, dental pain, difficulty breathing or swallowing.

## 2016-10-31 NOTE — Discharge Instructions (Signed)
I recommend continuing to take 25-50 mg of Benadryl every 4 hours as needed for swelling of her lip. Max dose of Benadryl is 300mg  daily.  Please follow up with a primary care provider from the Resource Guide provided below in 5-7 days if your symptoms have not improved. Please return to the Emergency Department if symptoms worsen or new onset of fever, swelling of tongue/throat, sensation of throat closing, unable to swallow resulting in drooling, difficulty breathing, wheezing, vomiting, unable to keep fluids down, rash.

## 2016-12-01 IMAGING — US US PELVIS COMPLETE
1 series · 13 of 25 positions shown · non-contrast
Comparison: None in PACs

CLINICAL DATA: Onset of sharp left upper quadrant pain 5 days ago,
now suprapubic pain, history of ovarian cysts, on birth control
pills, last menstrual period August 05, 2015.

EXAM:
TRANSABDOMINAL AND TRANSVAGINAL ULTRASOUND OF PELVIS
TECHNIQUE: Both transabdominal and transvaginal ultrasound examinations of the
pelvis were performed. Transabdominal technique was performed for
global imaging of the pelvis including uterus, ovaries, adnexal
regions, and pelvic cul-de-sac. It was necessary to proceed with
endovaginal exam following the transabdominal exam to visualize the
endometrium and adnexal structures.

[Series 1: us pelvis complete · 0.25mm/px · 13 of 68 slices shown]
[im 1/68]
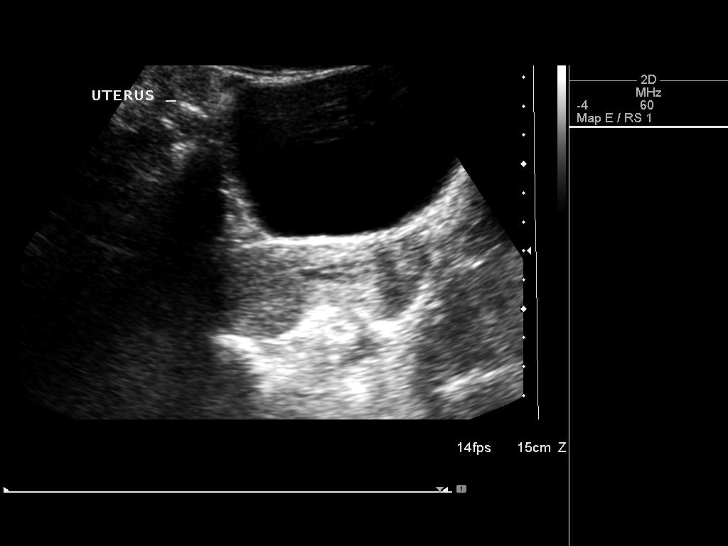
[im 6/68]
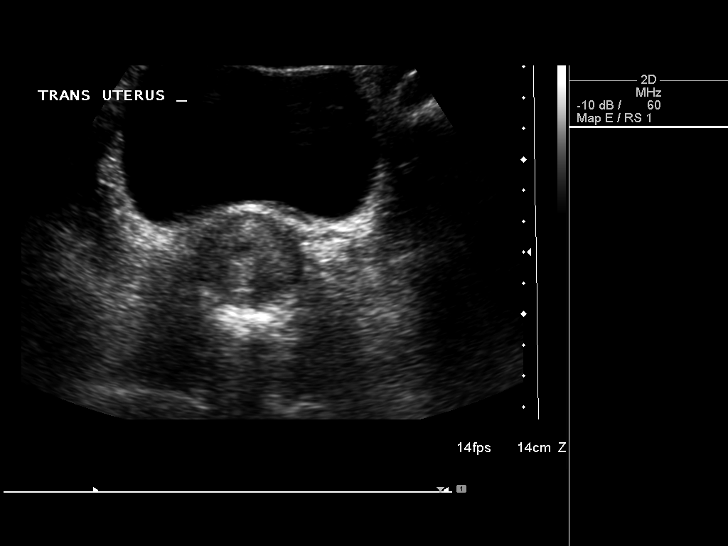
[im 12/68]
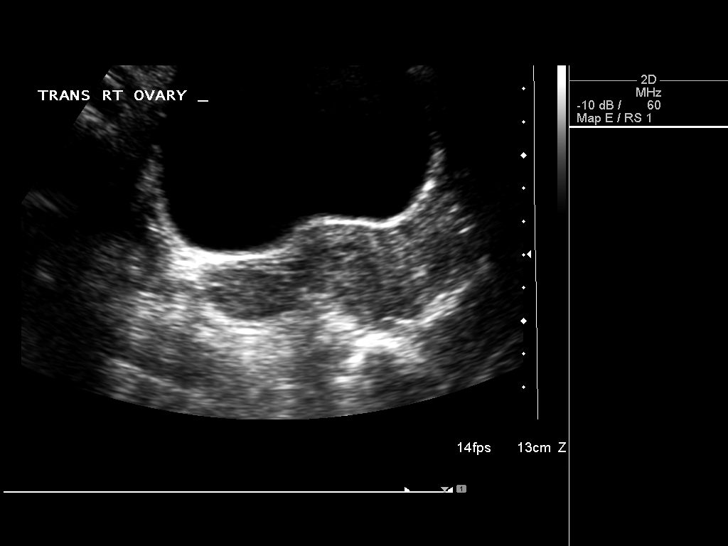
[im 17/68]
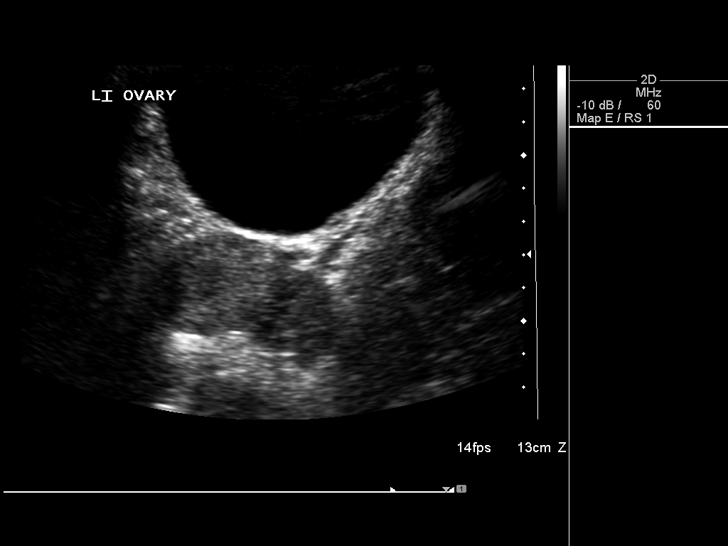
[im 23/68]
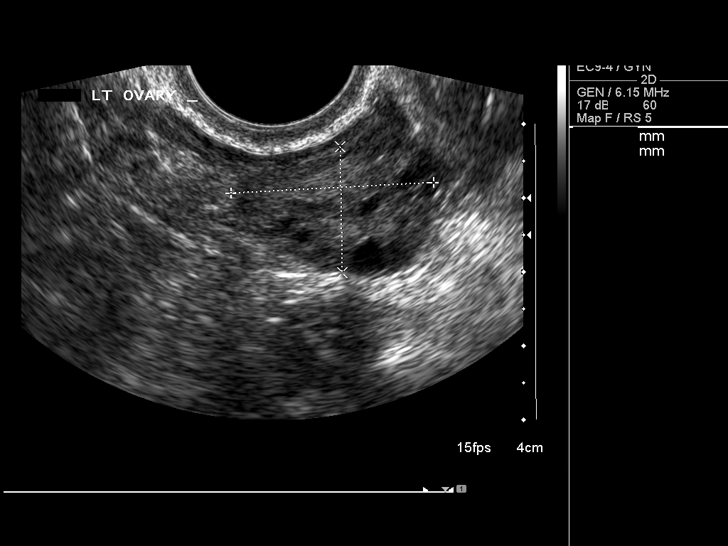
[im 28/68]
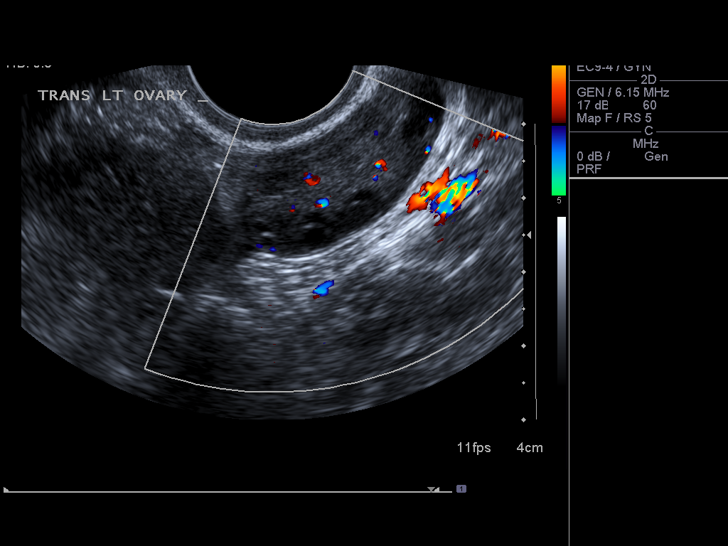
[im 34/68]
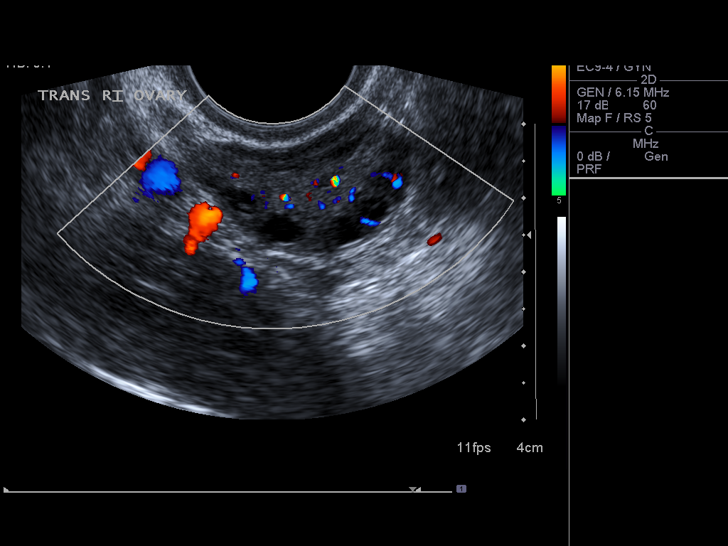
[im 40/68]
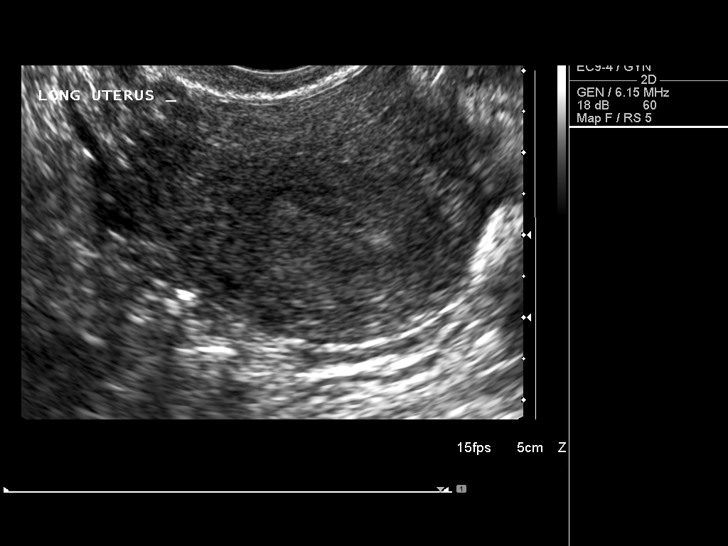
[im 45/68]
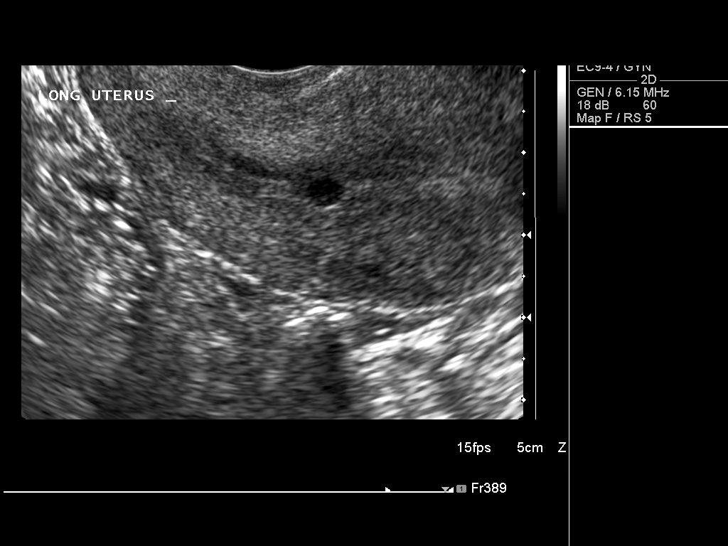
[im 51/68]
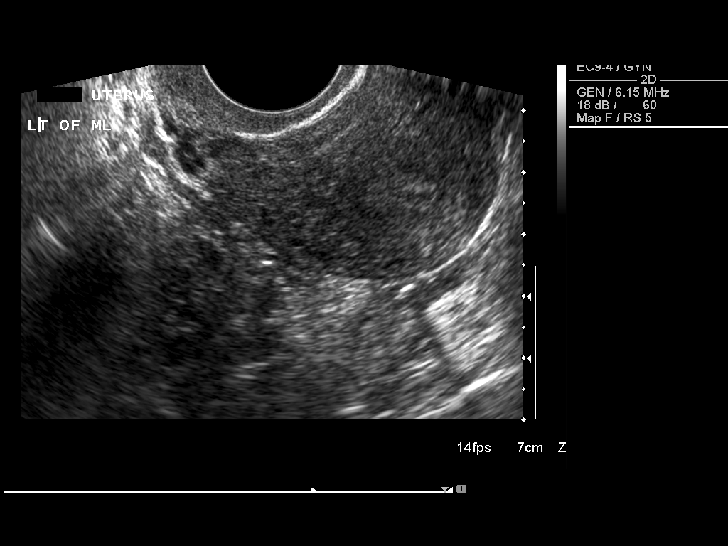
[im 56/68]
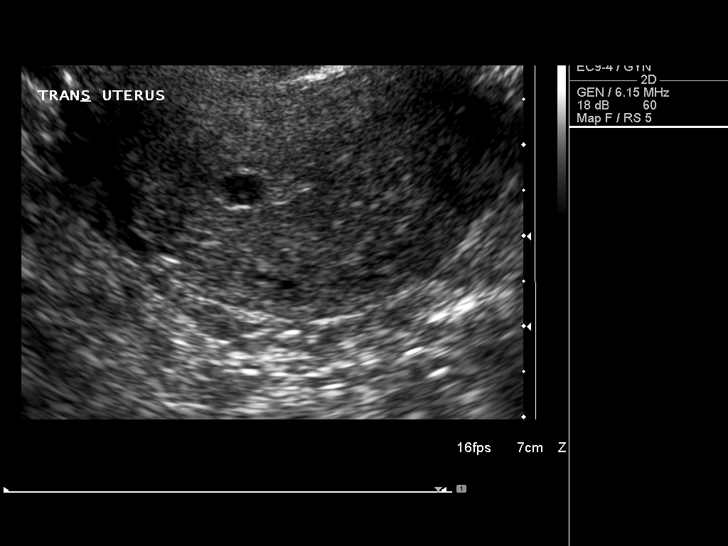
[im 62/68]
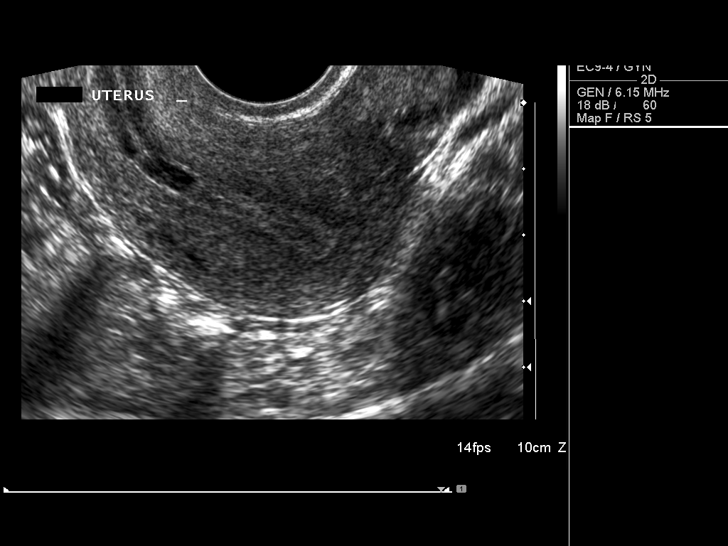
[im 68/68]
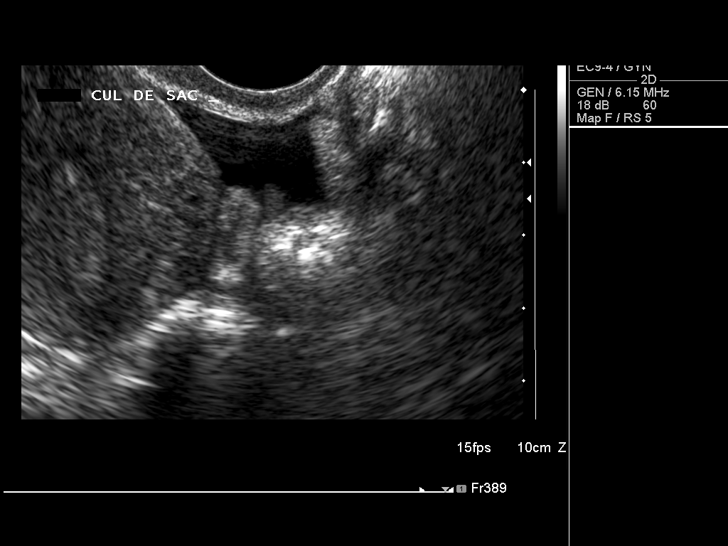

[13 of 25 positions shown; findings below may reference images not displayed]

FINDINGS: Uterus

Measurements: 7.3 x 3.3 x 4.1 cm. No fibroids or other mass
visualized.

Endometrium

Thickness: 5.9 mm. There are two tiny subendometrial cysts measuring
less than 6 mm.

Right ovary

Measurements: 3.0 x 1.4 x 3.1 cm. Normal appearance/no adnexal mass.

Left ovary

Measurements: 2.7 x 1.7 x 2.7 cm. Normal appearance/no adnexal mass.

Other findings

There is a small amount of free pelvic fluid.
IMPRESSION: 1. The uterus is normal in contour. The endometrial stripe is normal
in thickness. There are 2 sub cm subendometrial cyst.
2. The ovaries are normal in echotexture and size. No cystic or
solid adnexal masses are observed.
3. There is a small volume of free pelvic fluid.

## 2017-01-31 ENCOUNTER — Ambulatory Visit: Admitting: Allergy and Immunology

## 2017-04-21 ENCOUNTER — Emergency Department (HOSPITAL_COMMUNITY)
Admission: EM | Admit: 2017-04-21 | Discharge: 2017-04-22 | Disposition: A | Discharge status: Home / Self Care | Payer: Managed Care, Other (non HMO) | Attending: Emergency Medicine | Admitting: Emergency Medicine

## 2017-04-21 ENCOUNTER — Encounter (HOSPITAL_COMMUNITY): Payer: Self-pay

## 2017-04-21 DIAGNOSIS — R1032 Left lower quadrant pain: Secondary | ICD-10-CM | POA: Diagnosis not present

## 2017-04-21 DIAGNOSIS — Z3A01 Less than 8 weeks gestation of pregnancy: Secondary | ICD-10-CM | POA: Diagnosis not present

## 2017-04-21 DIAGNOSIS — J45909 Unspecified asthma, uncomplicated: Secondary | ICD-10-CM | POA: Insufficient documentation

## 2017-04-21 DIAGNOSIS — Z91010 Allergy to peanuts: Secondary | ICD-10-CM | POA: Diagnosis not present

## 2017-04-21 DIAGNOSIS — O209 Hemorrhage in early pregnancy, unspecified: Secondary | ICD-10-CM | POA: Insufficient documentation

## 2017-04-21 DIAGNOSIS — O4690 Antepartum hemorrhage, unspecified, unspecified trimester: Secondary | ICD-10-CM

## 2017-04-21 DIAGNOSIS — O26891 Other specified pregnancy related conditions, first trimester: Secondary | ICD-10-CM | POA: Diagnosis not present

## 2017-04-21 DIAGNOSIS — Z79899 Other long term (current) drug therapy: Secondary | ICD-10-CM | POA: Diagnosis not present

## 2017-04-21 DIAGNOSIS — R109 Unspecified abdominal pain: Secondary | ICD-10-CM

## 2017-04-21 LAB — I-STAT BETA HCG BLOOD, ED (MC, WL, AP ONLY): I-stat hCG, quantitative: 541 m[IU]/mL — ABNORMAL HIGH (ref <5)

## 2017-04-21 NOTE — ED Notes (Signed)
Pt states she is pregnant and has had some bleeding that started today. Pt describes it as initially being bright red and now it is dark red. Pt also complains of some lower left abd pain.

## 2017-04-21 NOTE — ED Triage Notes (Signed)
Pt states she is pregnant but unsure of how far she is; pt states she just found out on this past Sunday; pt states started spotting bright red today but has since turned to dark in color; Pt denies pain on arrival; LMP 03/06/17; Pt a&ox 4 on arrival.

## 2017-04-21 NOTE — ED Provider Notes (Signed)
[facility] Provider Note   CSN: [id number] Arrival date & time: 04/21/17  2212  By signing my name below, I, Cynda Acres, attest that this documentation has been prepared under the direction and in the presence of Kerrie Buffalo, NP. Electronically Signed: Cynda Acres, Scribe. 04/21/17. 11:52 PM.  History   Chief Complaint Chief Complaint  Patient presents with  . Possible Pregnancy  . Threatened Miscarriage  . Vaginal Bleeding   HPI Comments: Isabella Lam is a 21 y.o. female with a history of an ovarian cyst, who presents to the Emergency Department complaining of sudden-onset, intermittent vaginal bleeding that began earlier tonight at 6 pm. Patient states initially her blood was bright red, but is now dark in color. Patient reports taking a positive  home pregnancy test 6 days ago, but is unsure how far along she is. Patient reports being with her current sexual partner for 7 months. Patient is not taking any birth control medication currently. Patient reports associated LLQ abdominal pain. No modifying factors indicated. Patient describes the severity of her pain as a 5. Patient denies any avginal bleeding, dysuria, urinary frequency, fever, chills, nausea, vomiting, or any other symptoms.    The history is provided by the patient. No language interpreter was used.    Past Medical History:  Diagnosis Date  . Asthma   . Ovarian cyst     There are no active problems to display for this patient.   Past Surgical History:  Procedure Laterality Date  . WISDOM TOOTH EXTRACTION      OB History    No data available       Home Medications    Prior to Admission medications   Medication Sig Start Date End Date Taking? Authorizing Provider  adapalene (DIFFERIN) 0.1 % cream Apply topically at bedtime. Patient not taking: Reported on 07/26/2016 04/14/16   Jessica Priest, MD  albuterol (VENTOLIN HFA) 108 (90 Base) MCG/ACT inhaler Inhale 2 puffs into the lungs every 6 (six)  hours as needed for wheezing or shortness of breath.    [provider]  budesonide (PULMICORT FLEXHALER) 180 MCG/ACT inhaler Inhale 2 puffs into the lungs 2 (two) times daily. 03/04/16   Kozlow, Alvira Philips, MD  desonide (DESOWEN) 0.05 % ointment Can apply to inflamed skin twice daily as needed. 03/02/16   Kozlow, Alvira Philips, MD  diphenhydrAMINE (BENADRYL) 25 mg capsule Take 1 capsule (25 mg total) by mouth every 4 (four) hours as needed (lip swelling). 10/31/16   Barrett Henle, PA-C  EPINEPHrine 0.3 mg/0.3 mL IJ SOAJ injection Use as directed for life-threatening allergic reaction. 03/02/16   Kozlow, Alvira Philips, MD  fluticasone (VERAMYST) 27.5 MCG/SPRAY nasal spray Place 1 spray into the nose daily. 07/26/16   Kozlow, Alvira Philips, MD  Loratadine-Pseudoephedrine (CLARITIN-D 24 HOUR PO) Take by mouth daily.    [provider]  mometasone (NASONEX) 50 MCG/ACT nasal spray Place 1 spray into the nose as needed.    [provider]  montelukast (SINGULAIR) 10 MG tablet Take 1 tablet (10 mg total) by mouth daily. 07/26/16   Kozlow, Alvira Philips, MD    Family History Family History  Problem Relation Age of Onset  . Diabetes Mother   . High blood pressure Father   . Diabetes Maternal Grandmother   . High blood pressure Maternal Grandfather     Social History Social History  Substance Use Topics  . Smoking status: Never Smoker  . Smokeless tobacco: Never Used  . Alcohol use  No     Allergies   Clindamycin/lincomycin; Keflex [cephalexin]; Oxycodone; Peanut-containing drug products; Penicillins; Tomato; Hydrocodone; Hydrocortisone; Ibuprofen; and Tylenol [acetaminophen]   Review of Systems Review of Systems  Constitutional: Negative for fever.  Gastrointestinal: Positive for abdominal pain. Negative for nausea and vomiting.  Genitourinary: Positive for vaginal bleeding. Negative for frequency, hematuria, vaginal discharge and vaginal pain.     Physical Exam Updated Vital  Signs BP 110/61 (BP Location: Left Arm)   Pulse 65   Temp 98.9 F (37.2 C) (Oral)   Resp 16   LMP 03/06/2017 (Exact Date)   SpO2 100%   Physical Exam  Constitutional: She appears well-developed and well-nourished. No distress.  HENT:  Head: Normocephalic.  Eyes: EOM are normal.  Neck: Neck supple.  Cardiovascular: Normal rate and regular rhythm.   Pulmonary/Chest: Effort normal and breath sounds normal.  Abdominal: Soft. Bowel sounds are normal. There is tenderness. There is no rebound and no guarding.  LLQ tenderness, tenderness is mild. No CVA tenderness.   Genitourinary:  Genitourinary Comments: External genitalia without lesions, small blood vaginal vault, no CMT, cervix long, closed, no adnexal mass or tenderness palpated. Uterus without palpable enlargement.   Musculoskeletal: Normal range of motion.  Neurological: She is alert.  Skin: Skin is warm and dry.  Psychiatric: She has a normal mood and affect.  Nursing note and vitals reviewed.    ED Treatments / Results  COORDINATION OF CARE: 11:50 PM Discussed treatment plan with pt at bedside and pt agreed to plan, which includes a [place].   Labs (all labs ordered are listed, but only abnormal results are displayed) Labs Reviewed  I-STAT BETA HCG BLOOD, ED ([facility], [facility], [facility] ONLY) - Abnormal; Notable for the following:       Result Value   I-stat hCG, quantitative 541.0 (*)    All other components within normal limits  CBC WITH DIFFERENTIAL/PLATELET  ABO/RH  GC/CHLAMYDIA PROBE AMP ([place]) NOT AT [facility]    Radiology US Ob Comp Less 14 Wks  Result Date: 04/22/2017 CLINICAL DATA:  Spotting and left lower quadrant pain. Estimated gestational age by LMP is 6 weeks 0 days. Quantitative beta HCG is 541. EXAM: OBSTETRIC <14 WK [place] AND TRANSVAGINAL OB US TECHNIQUE: Both transabdominal and transvaginal ultrasound examinations were performed for complete evaluation of the gestation as well as the maternal uterus, adnexal regions, and  pelvic cul-de-sac. Transvaginal technique was performed to assess early pregnancy. COMPARISON:  None. FINDINGS: Intrauterine gestational sac: None Yolk sac:  Not Visualized. Embryo:  Not Visualized. Cardiac Activity: Not Visualized. Maternal uterus/adnexae: Uterus is retroverted. Uterus measures 6.3 x 4.2 by 4.3 cm. No myometrial mass lesions identified. Small nabothian cysts in the cervix. Endometrial stripe thickness is normal, measuring 9 mm. No endometrial fluid. Both ovaries are visualized and appear normal. No abnormal adnexal masses. Small amount of free fluid around the right ovary. IMPRESSION: No intrauterine gestational sac, yolk sac, or fetal pole identified. Differential considerations include intrauterine pregnancy too early to be sonographically visualized, missed abortion, or ectopic pregnancy. Followup ultrasound is recommended in 10-14 days for further evaluation. Electronically Signed   By: Burman Nieves M.D.   On: 04/22/2017 01:12   US Ob Transvaginal  Result Date: 04/22/2017 CLINICAL DATA:  Spotting and left lower quadrant pain. Estimated gestational age by LMP is 6 weeks 0 days. Quantitative beta HCG is 541. EXAM: OBSTETRIC <14 WK [place] AND TRANSVAGINAL OB US TECHNIQUE: Both transabdominal and transvaginal ultrasound examinations were performed for complete evaluation of  the gestation as well as the maternal uterus, adnexal regions, and pelvic cul-de-sac. Transvaginal technique was performed to assess early pregnancy. COMPARISON:  None. FINDINGS: Intrauterine gestational sac: None Yolk sac:  Not Visualized. Embryo:  Not Visualized. Cardiac Activity: Not Visualized. Maternal uterus/adnexae: Uterus is retroverted. Uterus measures 6.3 x 4.2 by 4.3 cm. No myometrial mass lesions identified. Small nabothian cysts in the cervix. Endometrial stripe thickness is normal, measuring 9 mm. No endometrial fluid. Both ovaries are visualized and appear normal. No abnormal adnexal masses. Small amount of  free fluid around the right ovary. IMPRESSION: No intrauterine gestational sac, yolk sac, or fetal pole identified. Differential considerations include intrauterine pregnancy too early to be sonographically visualized, missed abortion, or ectopic pregnancy. Followup ultrasound is recommended in 10-14 days for further evaluation. Electronically Signed   By: Burman NievesWilliam  Stevens M.D.   On: 04/22/2017 01:12    Procedures Procedures (including critical care time)  Medications Ordered in ED Medications - No data to display  I spoke with the provider in [facility] and they will see the patient for f/u in the clinic on Monday at 11:00 am. 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 I discussed strict ectopic precautions and importance of f/u in 48 hours weather it be in an ED, PCP, GYN, Urgent Care but she must follow up. She voices understanding and agrees to f/u in 48 hours.   Initial Impression / Assessment and Plan / ED Course  I have reviewed the triage vital signs and the nursing notes.  Pertinent labs & imaging results that were available during my care of the patient were reviewed by me and considered in my medical decision making (see chart for details).   Final Clinical Impressions(s) / ED Diagnoses   Final diagnoses:  Vaginal bleeding in pregnancy  Abdominal pain in pregnancy, first trimester    New Prescriptions New Prescriptions   No medications on file   I personally performed the services described in this documentation, which was scribed in my presence. The recorded information has been reviewed and is accurate.     Kerrie Buffaloeese, Hope ParklawnM, NP 04/22/17 0259    Melene PlanFloyd, Dan, DO 04/22/17 0302

## 2017-04-22 ENCOUNTER — Emergency Department (HOSPITAL_COMMUNITY): Payer: Managed Care, Other (non HMO)

## 2017-04-22 DIAGNOSIS — O209 Hemorrhage in early pregnancy, unspecified: Secondary | ICD-10-CM | POA: Diagnosis not present

## 2017-04-22 LAB — CBC WITH DIFFERENTIAL/PLATELET
BASOS ABS: 0 10*3/uL (ref 0.0–0.1)
Basophils Relative: 0 %
EOS PCT: 2 %
Eosinophils Absolute: 0.2 10*3/uL (ref 0.0–0.7)
HCT: 37 % (ref 36.0–46.0)
Hemoglobin: 12.9 g/dL (ref 12.0–15.0)
LYMPHS PCT: 32 %
Lymphs Abs: 2.9 10*3/uL (ref 0.7–4.0)
MCH: 30.1 pg (ref 26.0–34.0)
MCHC: 34.9 g/dL (ref 30.0–36.0)
MCV: 86.2 fL (ref 78.0–100.0)
MONO ABS: 0.8 10*3/uL (ref 0.1–1.0)
Monocytes Relative: 8 %
Neutro Abs: 5.2 10*3/uL (ref 1.7–7.7)
Neutrophils Relative %: 58 %
PLATELETS: 274 10*3/uL (ref 150–400)
RBC: 4.29 MIL/uL (ref 3.87–5.11)
RDW: 13.1 % (ref 11.5–15.5)
WBC: 9.1 10*3/uL (ref 4.0–10.5)

## 2017-04-22 LAB — ABO/RH: ABO/RH(D): O POS

## 2017-04-22 NOTE — Discharge Instructions (Signed)
It is very important that [facility], your doctor at home, another emergency department, Urgent Care or GYN in 48 hours to have your pregnancy hormone level repeated. Your level tonight was 541. With a normal pregnancy this number should double every 48 hours.  Your ultrasound tonight did not show a pregnancy in the uterus or outside the uterus. This must be followed very closely until the location of the pregnancy can be identified.

## 2017-04-24 LAB — GC/CHLAMYDIA PROBE AMP (~~LOC~~) NOT AT ARMC
Chlamydia: NEGATIVE
Neisseria Gonorrhea: NEGATIVE

## 2017-09-04 ENCOUNTER — Encounter (HOSPITAL_COMMUNITY): Payer: Self-pay | Admitting: Emergency Medicine

## 2017-09-04 ENCOUNTER — Emergency Department (HOSPITAL_COMMUNITY)
Admission: EM | Admit: 2017-09-04 | Discharge: 2017-09-04 | Disposition: A | Discharge status: Home / Self Care | Payer: Managed Care, Other (non HMO) | Attending: Emergency Medicine | Admitting: Emergency Medicine

## 2017-09-04 DIAGNOSIS — R0602 Shortness of breath: Secondary | ICD-10-CM | POA: Insufficient documentation

## 2017-09-04 DIAGNOSIS — J45909 Unspecified asthma, uncomplicated: Secondary | ICD-10-CM | POA: Diagnosis not present

## 2017-09-04 DIAGNOSIS — Z5321 Procedure and treatment not carried out due to patient leaving prior to being seen by health care provider: Secondary | ICD-10-CM | POA: Diagnosis not present

## 2017-09-04 NOTE — ED Triage Notes (Signed)
Pt. Stated, my asthma started up last night. I used my inhaler last at 1130 today and no help. I did notice a little pain on my stomach on the left side.

## 2017-09-04 NOTE — ED Notes (Addendum)
Pt stated that she is leaving. Pt encouraged to stay but stated that she "feels better".

## 2017-09-04 NOTE — ED Notes (Signed)
Patient approached tech first desk asking about wait time.  Explained to patient we are trying to get patients back to be triaged as soon as possible based on acuity.  Vitals taken and within normal limits.  Patient speaking in complete sentences with no obvious distress noted.  O2 100% Encouraged patient to wait and she will be called as soon as a triage room becomes available.

## 2017-10-15 ENCOUNTER — Inpatient Hospital Stay (HOSPITAL_COMMUNITY)
Admission: AD | Admit: 2017-10-15 | Discharge: 2017-10-15 | Disposition: A | Discharge status: Home / Self Care | Payer: Managed Care, Other (non HMO) | Source: Ambulatory Visit | Attending: Obstetrics & Gynecology | Admitting: Obstetrics & Gynecology

## 2017-10-15 ENCOUNTER — Encounter (HOSPITAL_COMMUNITY): Payer: Self-pay | Admitting: *Deleted

## 2017-10-15 DIAGNOSIS — J45909 Unspecified asthma, uncomplicated: Secondary | ICD-10-CM | POA: Diagnosis not present

## 2017-10-15 DIAGNOSIS — Z880 Allergy status to penicillin: Secondary | ICD-10-CM | POA: Diagnosis not present

## 2017-10-15 DIAGNOSIS — O3483 Maternal care for other abnormalities of pelvic organs, third trimester: Secondary | ICD-10-CM | POA: Insufficient documentation

## 2017-10-15 DIAGNOSIS — O99513 Diseases of the respiratory system complicating pregnancy, third trimester: Secondary | ICD-10-CM | POA: Insufficient documentation

## 2017-10-15 DIAGNOSIS — R55 Syncope and collapse: Secondary | ICD-10-CM | POA: Diagnosis present

## 2017-10-15 DIAGNOSIS — N83209 Unspecified ovarian cyst, unspecified side: Secondary | ICD-10-CM | POA: Insufficient documentation

## 2017-10-15 DIAGNOSIS — O9989 Other specified diseases and conditions complicating pregnancy, childbirth and the puerperium: Secondary | ICD-10-CM | POA: Insufficient documentation

## 2017-10-15 DIAGNOSIS — Z3A29 29 weeks gestation of pregnancy: Secondary | ICD-10-CM | POA: Insufficient documentation

## 2017-10-15 DIAGNOSIS — R42 Dizziness and giddiness: Secondary | ICD-10-CM

## 2017-10-15 DIAGNOSIS — Z881 Allergy status to other antibiotic agents status: Secondary | ICD-10-CM | POA: Insufficient documentation

## 2017-10-15 LAB — CBC WITH DIFFERENTIAL/PLATELET
Basophils Absolute: 0 10*3/uL (ref 0.0–0.1)
Basophils Relative: 0 %
Eosinophils Absolute: 0.2 10*3/uL (ref 0.0–0.7)
Eosinophils Relative: 2 %
HEMATOCRIT: 35 % — AB (ref 36.0–46.0)
HEMOGLOBIN: 12.5 g/dL (ref 12.0–15.0)
LYMPHS ABS: 1.8 10*3/uL (ref 0.7–4.0)
Lymphocytes Relative: 15 %
MCH: 31.6 pg (ref 26.0–34.0)
MCHC: 35.7 g/dL (ref 30.0–36.0)
MCV: 88.4 fL (ref 78.0–100.0)
MONOS PCT: 3 %
Monocytes Absolute: 0.4 10*3/uL (ref 0.1–1.0)
NEUTROS ABS: 10.3 10*3/uL — AB (ref 1.7–7.7)
NEUTROS PCT: 80 %
Platelets: 236 10*3/uL (ref 150–400)
RBC: 3.96 MIL/uL (ref 3.87–5.11)
RDW: 13.9 % (ref 11.5–15.5)
WBC: 12.7 10*3/uL — ABNORMAL HIGH (ref 4.0–10.5)

## 2017-10-15 LAB — URINALYSIS, ROUTINE W REFLEX MICROSCOPIC
BILIRUBIN URINE: NEGATIVE
Bacteria, UA: NONE SEEN
GLUCOSE, UA: NEGATIVE mg/dL
Hgb urine dipstick: NEGATIVE
Ketones, ur: NEGATIVE mg/dL
Nitrite: NEGATIVE
PH: 6 (ref 5.0–8.0)
Protein, ur: NEGATIVE mg/dL
SPECIFIC GRAVITY, URINE: 1.019 (ref 1.005–1.030)

## 2017-10-15 NOTE — MAU Provider Note (Signed)
History     CSN: [id number]  Arrival date & time 10/15/17  1654   None     Chief Complaint  Patient presents with  . Loss of Consciousness    Isabella Lam 21 y.o. G1P0 [redacted]w[redacted]d presents to MAU after having a fainting spell at [employer] while checking out. She was eased down into a chair and did not fall    Past Medical History:  Diagnosis Date  . Asthma   . Ovarian cyst   . Ovarian cyst     Past Surgical History:  Procedure Laterality Date  . WISDOM TOOTH EXTRACTION      Family History  Problem Relation Age of Onset  . Diabetes Mother   . High blood pressure Father   . Diabetes Maternal Grandmother   . High blood pressure Maternal Grandfather     Social History   Tobacco Use  . Smoking status: Never Smoker  . Smokeless tobacco: Never Used  Substance Use Topics  . Alcohol use: No  . Drug use: No    OB History    Gravida Para Term Preterm AB Living   1             SAB TAB Ectopic Multiple Live Births                  Review of Systems  Neurological: Positive for syncope.  All other systems reviewed and are negative.   Allergies  Clindamycin/lincomycin; Keflex [cephalexin]; Oxycodone; Peanut-containing drug products; Penicillins; Tomato; Hydrocodone; Hydrocortisone; Ibuprofen; and Tylenol [acetaminophen]  Home Medications    BP (!) 103/58 (BP Location: Left Arm)   Pulse (!) 102   Temp 98.3 F (36.8 C) (Oral)   Resp 16   LMP 03/13/2017   SpO2 100%   Physical Exam  Constitutional: She is oriented to person, place, and time. She appears well-developed and well-nourished.  HENT:  Head: Normocephalic and atraumatic.  Cardiovascular: Normal rate and regular rhythm.  Pulmonary/Chest: Effort normal. No respiratory distress.  Abdominal: Soft.  Genitourinary: Vagina normal.  Musculoskeletal: Normal range of motion.  Neurological: She is alert and oriented to person, place, and time.  Skin: Skin is warm and dry.  Psychiatric: She has a normal mood and  affect. Her behavior is normal. Thought content normal.  Nursing note and vitals reviewed.   MAU Course  Procedures (including critical care time)  Labs Reviewed  URINALYSIS, ROUTINE W REFLEX MICROSCOPIC - Abnormal; Notable for the following components:      Result Value   APPearance HAZY (*)    Leukocytes, UA TRACE (*)    Squamous Epithelial / LPF 0-5 (*)    All other components within normal limits  CBC WITH DIFFERENTIAL/PLATELET   No results found.  Results for orders placed or performed during the hospital encounter of 10/15/17 (from the past 24 hour(s))  Urinalysis, Routine w reflex microscopic     Status: Abnormal   Collection Time: 10/15/17  5:10 PM  Result Value Ref Range   Color, Urine YELLOW YELLOW   APPearance HAZY (A) CLEAR   Specific Gravity, Urine 1.019 1.005 - 1.030   pH 6.0 5.0 - 8.0   Glucose, UA NEGATIVE NEGATIVE mg/dL   Hgb urine dipstick NEGATIVE NEGATIVE   Bilirubin Urine NEGATIVE NEGATIVE   Ketones, ur NEGATIVE NEGATIVE mg/dL   Protein, ur NEGATIVE NEGATIVE mg/dL   Nitrite NEGATIVE NEGATIVE   Leukocytes, UA TRACE (A) NEGATIVE   RBC / HPF 0-5 0 - 5 RBC/hpf   WBC,  UA 0-5 0 - 5 WBC/hpf   Bacteria, UA NONE SEEN NONE SEEN   Squamous Epithelial / LPF 0-5 (A) NONE SEEN   Mucus PRESENT    No diagnosis found.    MDM  CBC pending. Baby Cat 1 ; No contractions. Maurine SimmeringNancy Prothera to assume care Illene BolusLori Clemmons CNM 10/15/17 @ 658pm  CBC reviewed and normal. HH 12.5/35  plts 236 Discussed making sure enough fluids and small frequent meals.  Getting up slowly and fetal kick counts.  Pt to follow up in office on Tuesday.  Bernerd PhoNancy Treasure Ingrum CNM

## 2017-10-15 NOTE — Discharge Instructions (Signed)

## 2017-10-15 NOTE — MAU Note (Signed)
[facility], thinks she passed out, when she woke up someone had helped her into a chair.  Pt denies pain.  No contractions, bleeding or LOF.

## 2018-03-22 ENCOUNTER — Encounter (HOSPITAL_COMMUNITY): Payer: Self-pay

## 2019-02-08 IMAGING — US US OB TRANSVAGINAL
1 series · 13 of 28 positions shown · non-contrast
Comparison: None.

CLINICAL DATA: Spotting and left lower quadrant pain. Estimated
gestational age by LMP is 6 weeks 0 days. Quantitative beta HCG is
541.

EXAM:
OBSTETRIC <14 WK US AND TRANSVAGINAL OB US
TECHNIQUE: Both transabdominal and transvaginal ultrasound examinations were
performed for complete evaluation of the gestation as well as the
maternal uterus, adnexal regions, and pelvic cul-de-sac.
Transvaginal technique was performed to assess early pregnancy.

[Series 1: us ob transvaginal · 0.22mm/px · 13 of 81 slices shown]
[im 3/81]
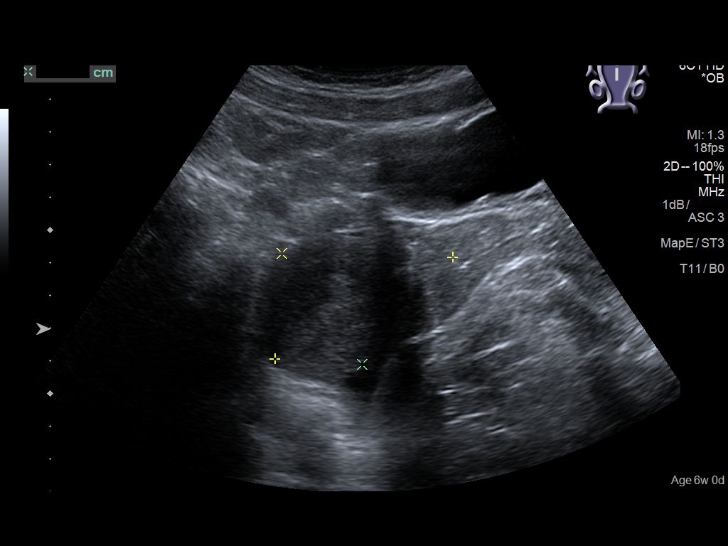
[im 9/81]
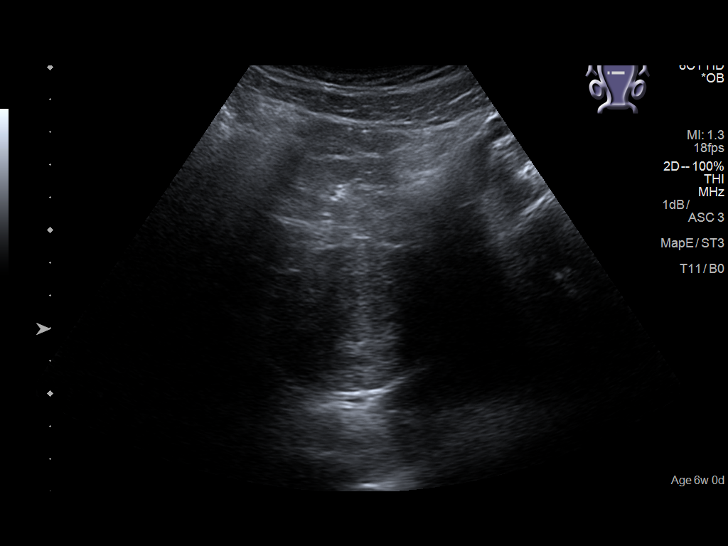
[im 15/81]
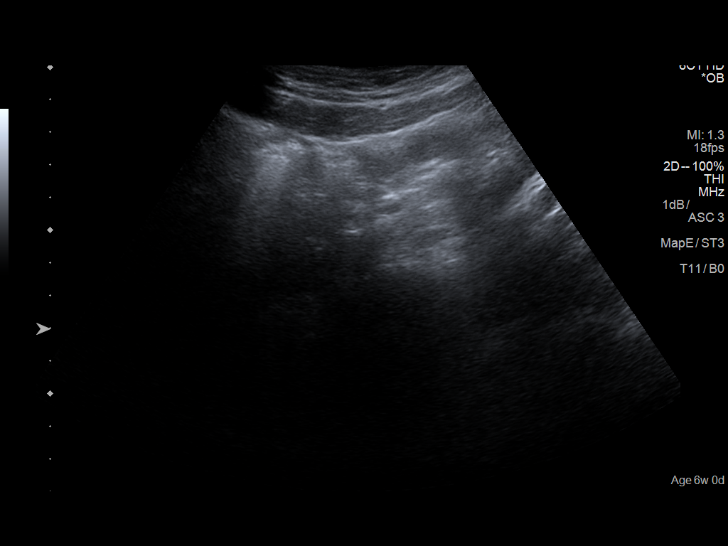
[im 21/81]
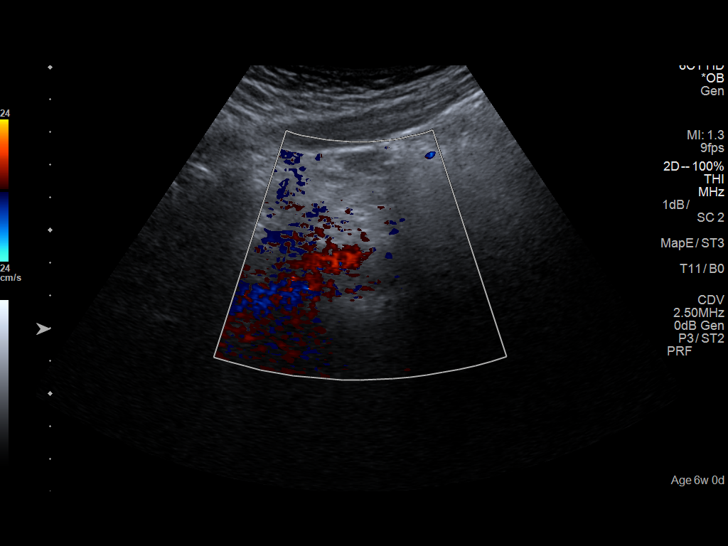
[im 27/81]
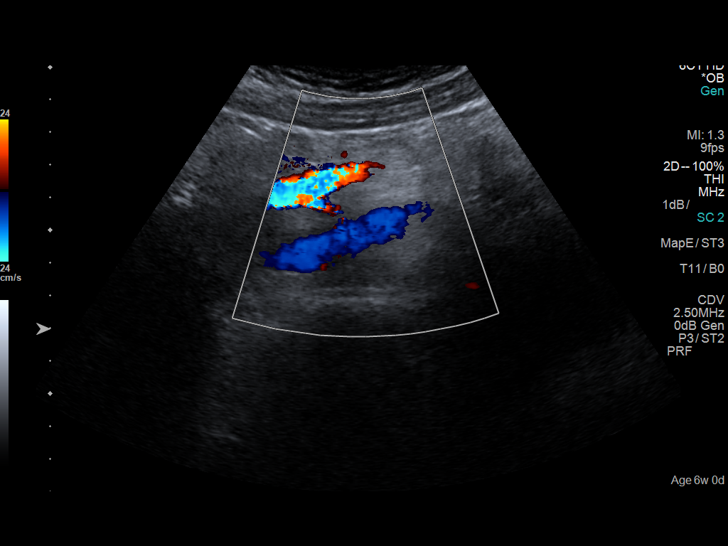
[im 33/81]
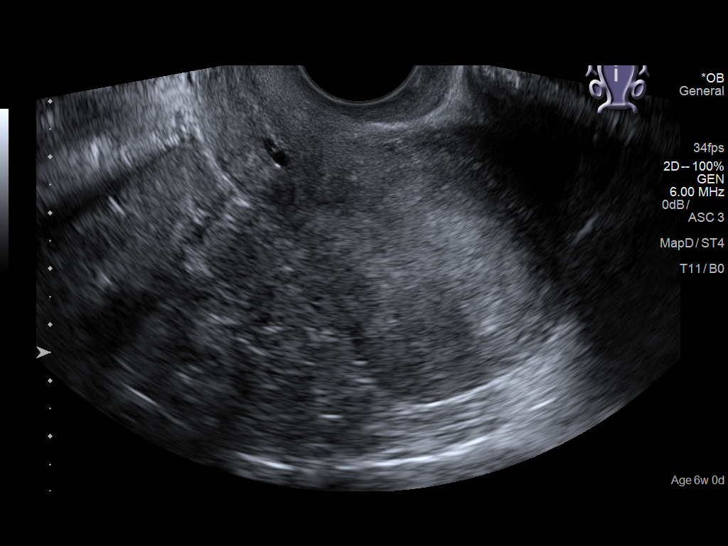
[im 42/81]
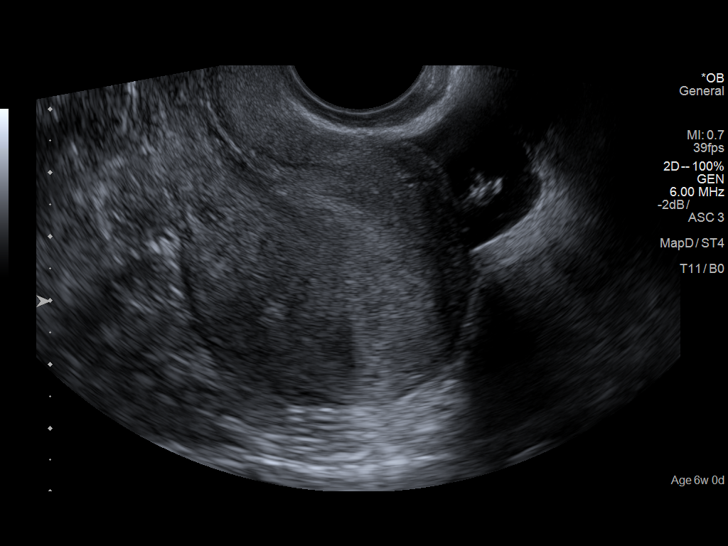
[im 48/81]
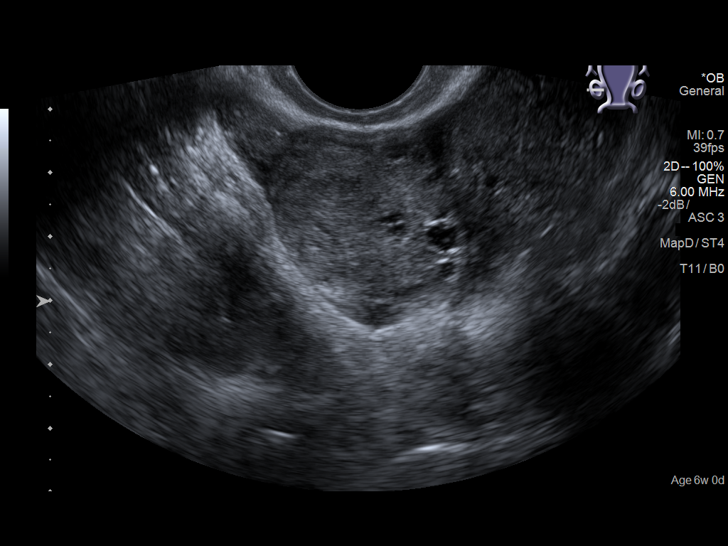
[im 54/81]
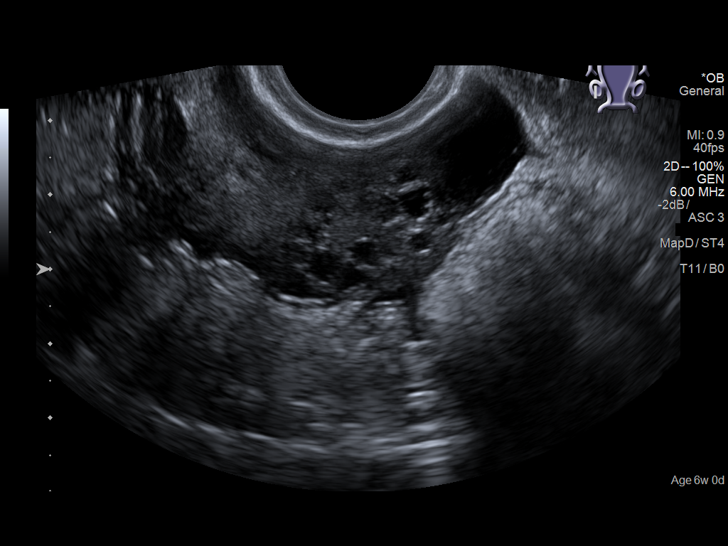
[im 60/81]
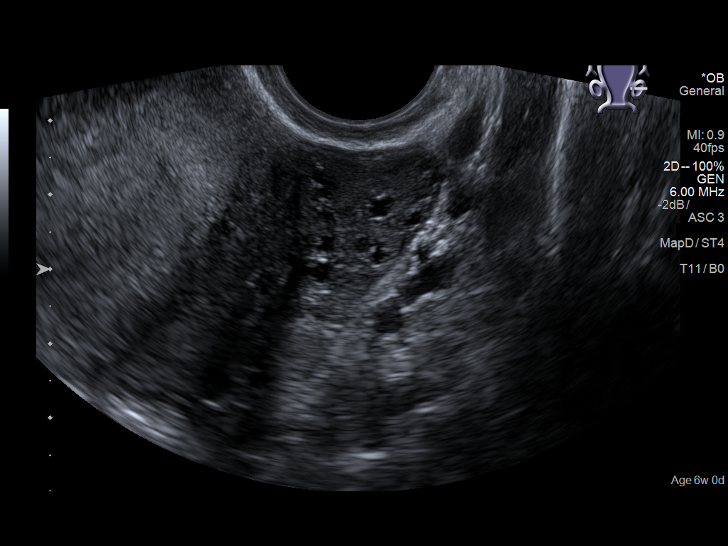
[im 66/81]
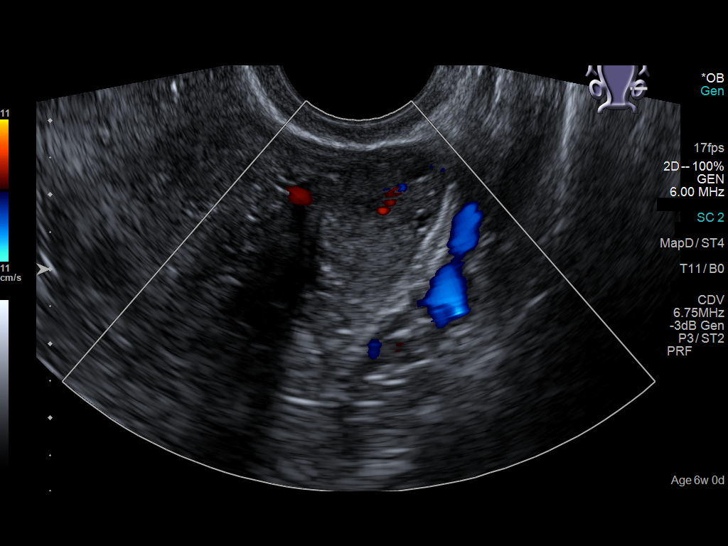
[im 72/81]
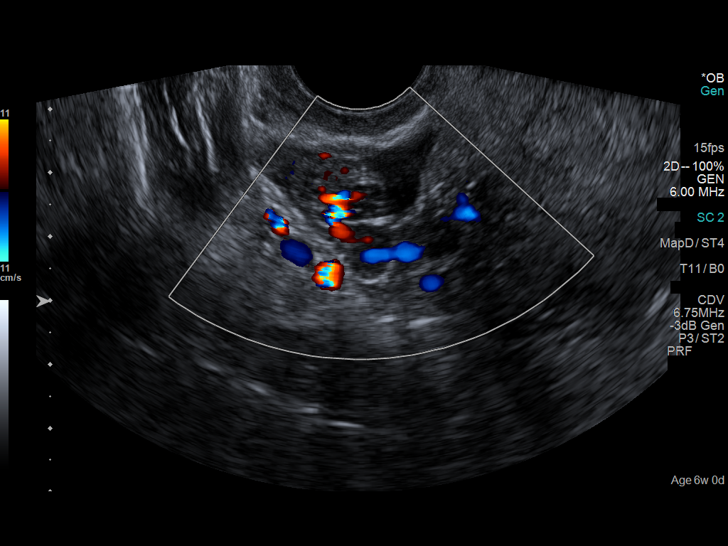
[im 78/81]
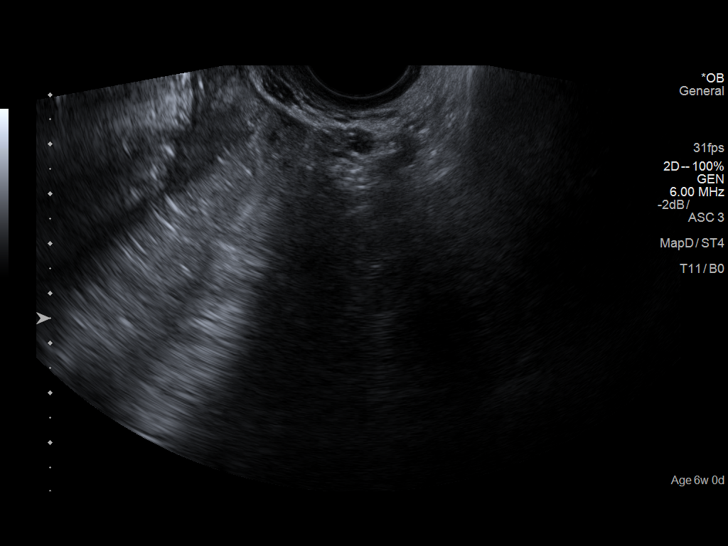

[13 of 28 positions shown; findings below may reference images not displayed]

FINDINGS: Intrauterine gestational sac: None

Yolk sac:  Not Visualized.

Embryo:  Not Visualized.

Cardiac Activity: Not Visualized.

Maternal uterus/adnexae: Uterus is retroverted. Uterus measures
x 4.2 by 4.3 cm. No myometrial mass lesions identified. Small
nabothian cysts in the cervix. Endometrial stripe thickness is
normal, measuring 9 mm. No endometrial fluid.

Both ovaries are visualized and appear normal. No abnormal adnexal
masses. Small amount of free fluid around the right ovary.
IMPRESSION: No intrauterine gestational sac, yolk sac, or fetal pole identified.
Differential considerations include intrauterine pregnancy too early
to be sonographically visualized, missed abortion, or ectopic
pregnancy. Followup ultrasound is recommended in 10-14 days for
further evaluation.
